# Patient Record
Sex: Male | Born: 1990 | Hispanic: Yes | Marital: Single | Smoking: Never smoker
Health system: Southern US, Community
[De-identification: ages and names within clinical notes are randomized; demographics above are authoritative.]

---

## 2011-05-12 ENCOUNTER — Emergency Department (HOSPITAL_COMMUNITY): Payer: Self-pay

## 2011-05-12 ENCOUNTER — Emergency Department (HOSPITAL_COMMUNITY)
Admission: EM | Admit: 2011-05-12 | Discharge: 2011-05-12 | Disposition: A | Discharge status: Home / Self Care | Payer: Self-pay | Attending: Emergency Medicine | Admitting: Emergency Medicine

## 2011-05-12 ENCOUNTER — Encounter: Payer: Self-pay | Admitting: Adult Health

## 2011-05-12 DIAGNOSIS — S61209A Unspecified open wound of unspecified finger without damage to nail, initial encounter: Secondary | ICD-10-CM | POA: Insufficient documentation

## 2011-05-12 DIAGNOSIS — Y9269 Other specified industrial and construction area as the place of occurrence of the external cause: Secondary | ICD-10-CM | POA: Insufficient documentation

## 2011-05-12 DIAGNOSIS — S61219A Laceration without foreign body of unspecified finger without damage to nail, initial encounter: Secondary | ICD-10-CM

## 2011-05-12 DIAGNOSIS — Y990 Civilian activity done for income or pay: Secondary | ICD-10-CM | POA: Insufficient documentation

## 2011-05-12 DIAGNOSIS — W260XXA Contact with knife, initial encounter: Secondary | ICD-10-CM | POA: Insufficient documentation

## 2011-05-12 DIAGNOSIS — S62639A Displaced fracture of distal phalanx of unspecified finger, initial encounter for closed fracture: Secondary | ICD-10-CM | POA: Insufficient documentation

## 2011-05-12 MED ORDER — OXYCODONE-ACETAMINOPHEN 5-325 MG PO TABS
1.0000 | ORAL_TABLET | Freq: Once | ORAL | Status: AC
  Administered 2011-05-12: 1 via ORAL
  Filled 2011-05-12: qty 1

## 2011-05-12 MED ORDER — TETANUS-DIPHTH-ACELL PERTUSSIS 5-2.5-18.5 LF-MCG/0.5 IM SUSP
0.5000 mL | Freq: Once | INTRAMUSCULAR | Status: AC
  Administered 2011-05-12: 0.5 mL via INTRAMUSCULAR
  Filled 2011-05-12: qty 0.5

## 2011-05-12 MED ORDER — LIDOCAINE HCL 2 % IJ SOLN
INTRAMUSCULAR | Status: AC
  Administered 2011-05-12: 3 mL via SUBCUTANEOUS
  Filled 2011-05-12: qty 1

## 2011-05-12 MED ORDER — CEPHALEXIN 500 MG PO CAPS: 500.0000 mg | ORAL_CAPSULE | Freq: Four times a day (QID) | ORAL | Status: AC

## 2011-05-12 MED ORDER — HYDROCODONE-ACETAMINOPHEN 5-325 MG PO TABS: 2.0000 | ORAL_TABLET | ORAL | Status: AC | PRN

## 2011-05-12 NOTE — ED Notes (Signed)
Cut left pointer and middle finger with knife while at work/

## 2011-05-12 NOTE — ED Provider Notes (Signed)
History     CSN: [id number] Arrival date & time: 05/12/2011  3:50 PM   First MD Initiated Contact with Patient 05/12/11 1552      Chief Complaint  Patient presents with  . Laceration    (Consider location/radiation/quality/duration/timing/severity/associated sxs/prior treatment) HPI Comments: Patient reports that he was cutting a garden hose with a knife and cut his 2nd and 3rd digit.  He was seen by urgent care and then sent to the Emergency Department from there.  Patient is a 20 y.o. male presenting with skin laceration. The history is provided by the patient.  Laceration  The incident occurred 1 to 2 hours ago. Pain location: 2nd and 3rd digit left hand. Size: approximately 1.5 cm. The laceration mechanism was a a dirty knife. The pain is moderate. It is unknown if a foreign body is present. His tetanus status is unknown.    History reviewed. No pertinent past medical history.  History reviewed. No pertinent past surgical history.  History reviewed. No pertinent family history.  History  Substance Use Topics  . Smoking status: Never Smoker   . Smokeless tobacco: Not on file  . Alcohol Use: No      Review of Systems  Constitutional: Negative for fever, chills and diaphoresis.  Respiratory: Negative for cough, chest tightness and shortness of breath.   Cardiovascular: Negative for chest pain.  Gastrointestinal: Negative for nausea, vomiting and abdominal pain.  Skin: Negative for color change and pallor.  Neurological: Negative for dizziness, tremors, light-headedness and numbness.    Allergies  Review of patient's allergies indicates no known allergies.  Home Medications   Current Outpatient Rx  Name Route Sig Dispense Refill  . CEPHALEXIN 500 MG PO CAPS Oral Take 1 capsule (500 mg total) by mouth 4 (four) times daily. 20 capsule 0  . HYDROCODONE-ACETAMINOPHEN 5-325 MG PO TABS Oral Take 2 tablets by mouth every 4 (four) hours as needed for pain. 10 tablet 0     BP 144/77  Pulse 92  Temp(Src) 98.4 F (36.9 C) (Oral)  Resp 16  SpO2 99%  Physical Exam  Constitutional: He is oriented to person, place, and time. He appears well-developed and well-nourished.  HENT:  Head: Normocephalic.  Cardiovascular: Normal rate, regular rhythm, normal heart sounds and intact distal pulses.   Pulmonary/Chest: Effort normal and breath sounds normal.  Musculoskeletal:       Full ROM of the all of the fingers of the left hand.  Normal sensation and normal movement of the distal 2nd and 3rd digits on the left hand.  Neurological: He is alert and oriented to person, place, and time. No sensory deficit. Gait normal.  Skin:       Approximately 1.5 cm laceration over the DIP joint on the palmar surface of the 2nd and 3rd digit.  Fingers neurovascularly intact.  Psychiatric: He has a normal mood and affect.    ED Course  Procedures (including critical care time)  Labs Reviewed - No data to display Dg Hand Complete Left  05/12/2011  *RADIOLOGY REPORT*  Clinical Data: Laceration middle and index finger  LEFT HAND - COMPLETE 3+ VIEW  Comparison: None.  Findings: Three views of the left hand submitted.  No displaced fracture is noted.  No radiopaque foreign body.  Tiny avulsion fracture noted distal phalanx second finger.  IMPRESSION: Tiny avulsion fracture of the distal phalanx of the left second finger.  No radiopaque foreign body.  Original Report Authenticated By: Natasha Mead, M.D.  1. Laceration of finger   2. Avulsion fracture of distal phalanx of finger     1:18 PM Discussed the patient with Dr. Lajoyce Corners with hand surgery.  He recommended cleaning the area and placing a couple of sutures to loosely hold the skin together and putting the patient on Keflex.  He reports that he will follow up with the patient in the office on Monday.  LACERATION REPAIR Performed by: Anne Shutter, Farren Landa Authorized by: Anne Shutter, Herbert Seta Consent: Verbal consent  obtained. Risks and benefits: risks, benefits and alternatives were discussed Consent given by: patient Patient identity confirmed: provided demographic data Prepped and Draped in normal sterile fashion Wound explored  Laceration Location: Palmer surface of 2nd digit over the DIP joint.  Laceration Length: 1.5 cm  No Foreign Bodies seen or palpated  Anesthesia: local infiltration  Local anesthetic: lidocaine 2% without epinephrine  Anesthetic total: 3 ml  Irrigation method: syringe Amount of cleaning: standard  Skin closure: 3-0 Prolene  Number of sutures: 2  Technique:  Simple interuputed  Patient tolerance: Patient tolerated the procedure well with no immediate complications.  MDM  Patient neurovascularly intact.  Area has stopped bleeding.  Sutures placed and patient has f/u appointment with Dr. Lajoyce Corners on Monday.  Patient's tetanus given while in the ED and patient prescribed Keflex.        [facility] 05/13/11 1325

## 2011-05-14 NOTE — ED Provider Notes (Signed)
Medical screening examination/treatment/procedure(s) were performed by non-physician practitioner and as supervising physician I was immediately available for consultation/collaboration.   Sidharth Leverette, MD 05/14/11 1527 

## 2013-03-10 IMAGING — CR DG HAND COMPLETE 3+V*L*
3 series · 3 of 3 positions shown · non-contrast
Comparison: None.

CLINICAL DATA: Laceration middle and index finger

LEFT HAND - COMPLETE 3+ VIEW

[x hand pa left]
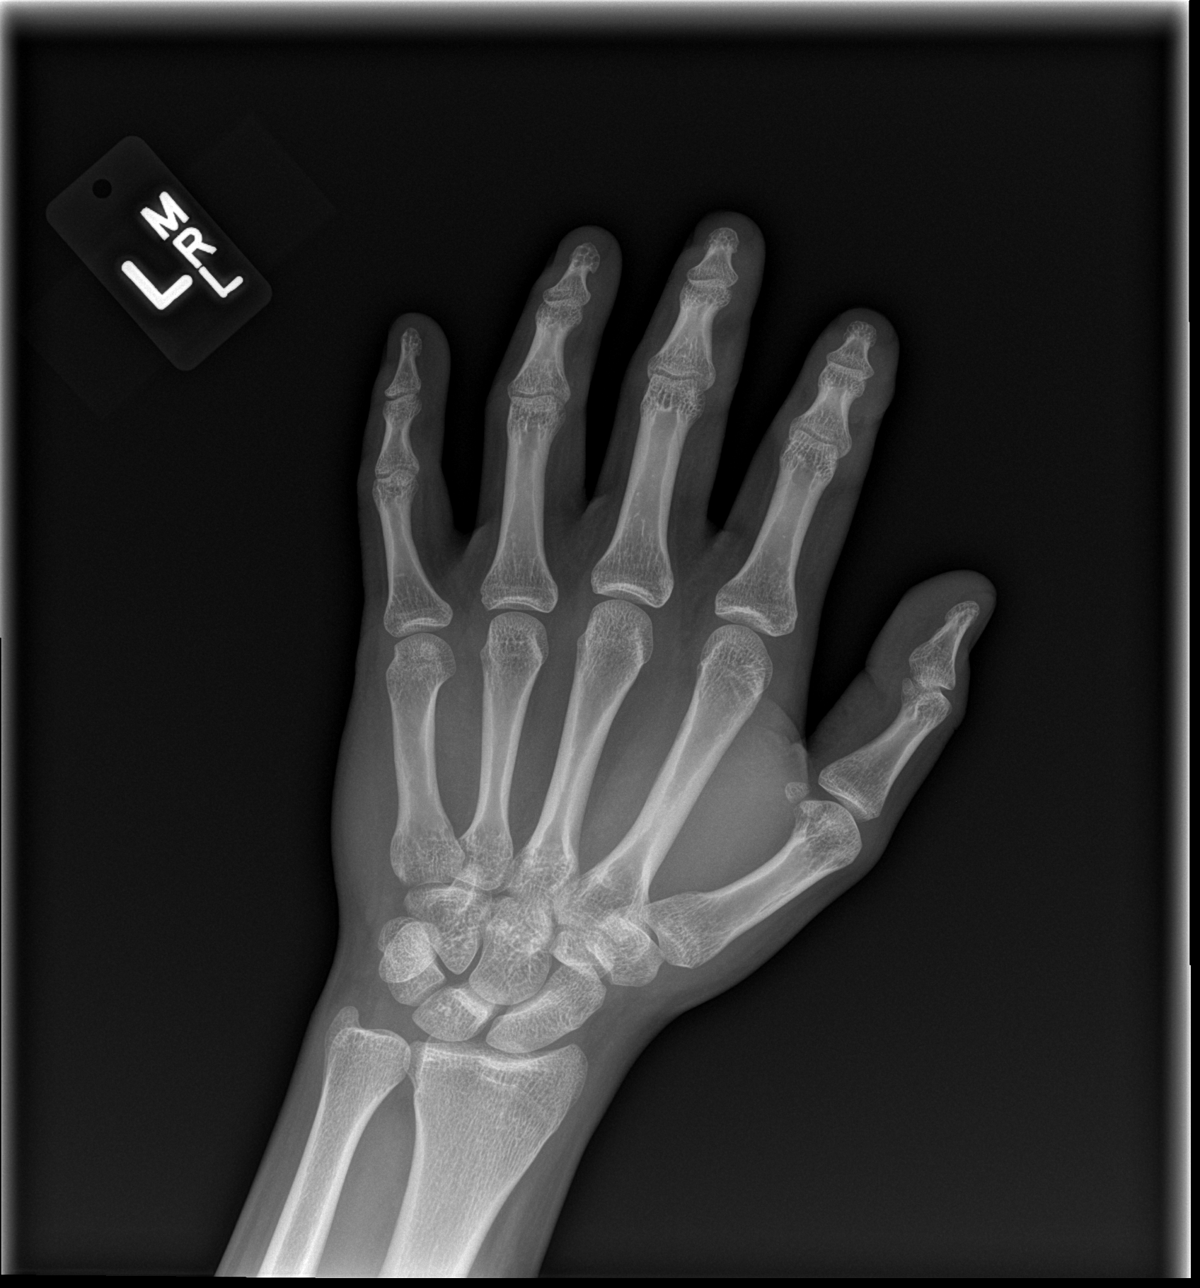

[x hand obl left]
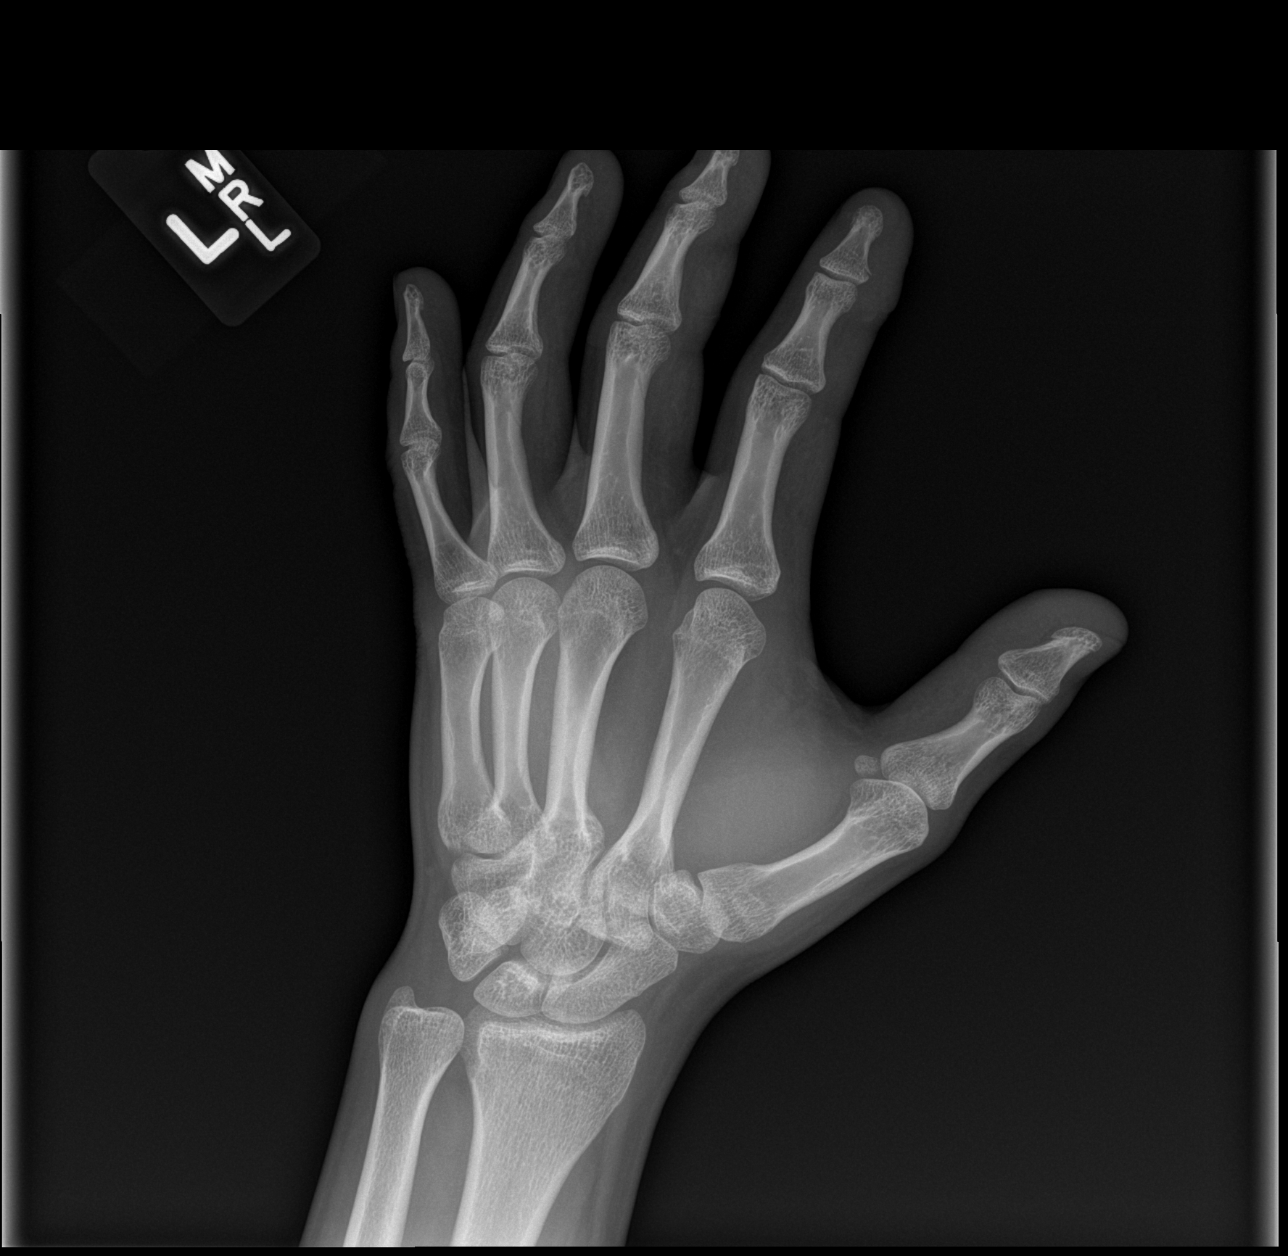

[x hand lat left]
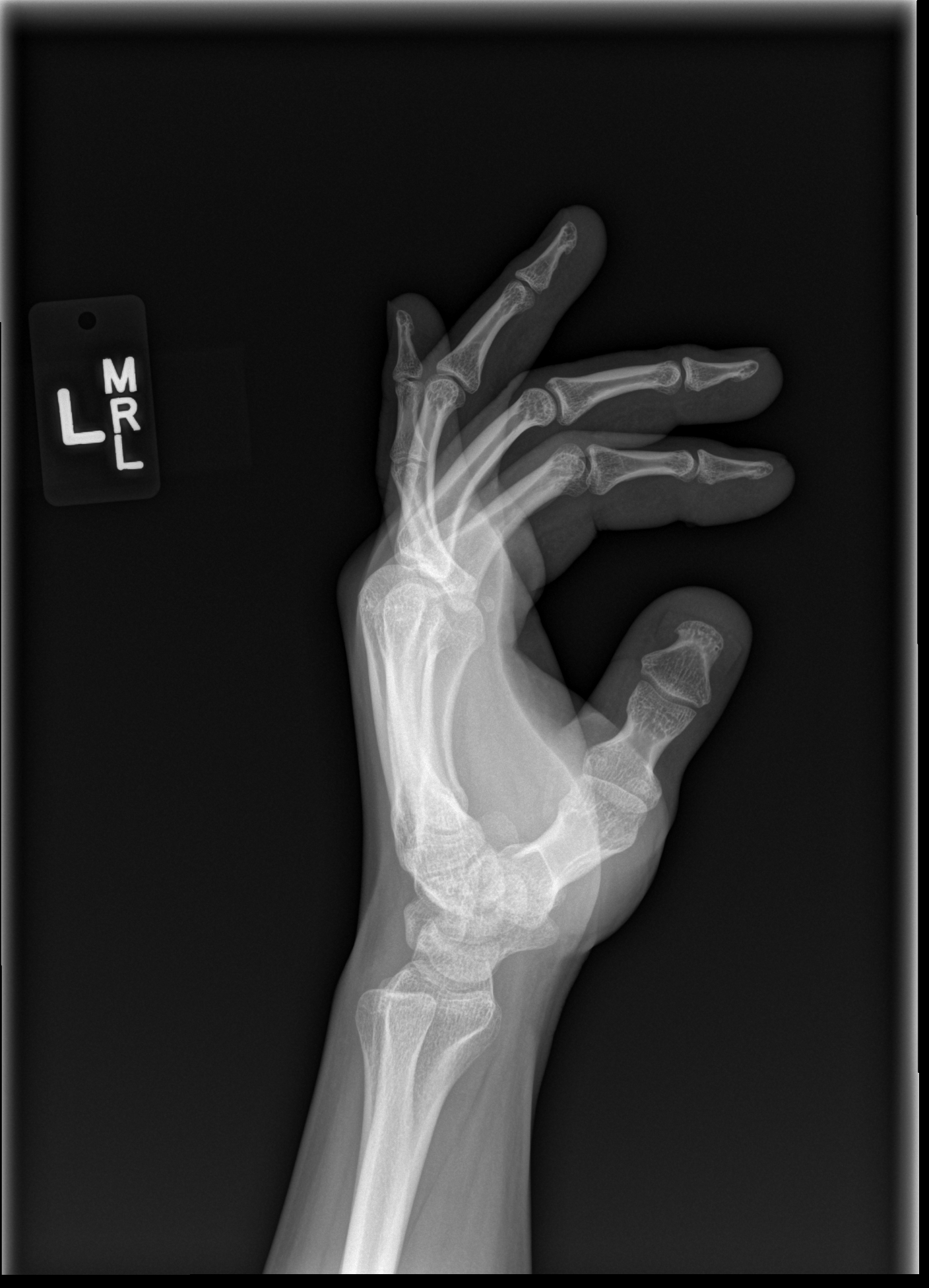

[3 of 3 positions shown; findings below may reference images not displayed]

FINDINGS: Three views of the left hand submitted.  No displaced
fracture is noted.  No radiopaque foreign body.  Tiny avulsion
fracture noted distal phalanx second finger.
IMPRESSION: Tiny avulsion fracture of the distal phalanx of the left second
finger.  No radiopaque foreign body.

## 2023-01-17 ENCOUNTER — Ambulatory Visit
Admission: EM | Admit: 2023-01-17 | Discharge: 2023-01-17 | Disposition: A | Discharge status: Home / Self Care | Payer: Self-pay | Attending: Internal Medicine | Admitting: Internal Medicine

## 2023-01-17 DIAGNOSIS — S39012A Strain of muscle, fascia and tendon of lower back, initial encounter: Secondary | ICD-10-CM

## 2023-01-17 MED ORDER — IBUPROFEN 600 MG PO TABS: 600.0000 mg | ORAL_TABLET | Freq: Four times a day (QID) | ORAL | 0 refills | Status: AC | PRN

## 2023-01-17 MED ORDER — KETOROLAC TROMETHAMINE 30 MG/ML IJ SOLN
30.0000 mg | Freq: Once | INTRAMUSCULAR | Status: AC
  Administered 2023-01-17: 30 mg via INTRAMUSCULAR

## 2023-01-17 MED ORDER — METHOCARBAMOL 500 MG PO TABS: 500.0000 mg | ORAL_TABLET | Freq: Two times a day (BID) | ORAL | 0 refills | Status: AC

## 2023-01-17 NOTE — ED Provider Notes (Signed)
[facility]    CSN: [id number] Arrival date & time: 01/17/23  1106      History   Chief Complaint Chief Complaint  Patient presents with   Back Pain    HPI Zachary Marquez is a 32 y.o. male.   Patient presents to urgent care for evaluation of right low back pain that started yesterday morning while he was at work. States he had to pick up a heavy case of meat and turn his torso to the right with the object to set it down to the right. He placed the object down and started having pain immediately to the right low back. 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 Pain worsened throughout the day but he kept working. Low back pain does not radiate and is triggered by movement and position changes. Pain is currently 7/10 and described as a "sore muscle ache". Movement makes pain "intense". Not moving makes pain go away. No numbness or tingling to bilateral lower extremities, extremity weakness, urinary symptoms, loss of bowel/bladder control, or saddle anesthesia symptoms. No midline tenderness. Purchased over the counter pain medication this morning, believes it may have been tylenol, and took this with some relief.    Back Pain   History reviewed. No pertinent past medical history.  There are no problems to display for this patient.   History reviewed. No pertinent surgical history.     Home Medications    Prior to Admission medications   Medication Sig Start Date End Date Taking? Authorizing Provider  ibuprofen (ADVIL) 600 MG tablet Take 1 tablet (600 mg total) by mouth every 6 (six) hours as needed. 01/17/23  Yes Carlisle Beers, FNP  methocarbamol (ROBAXIN) 500 MG tablet Take 1 tablet (500 mg total) by mouth 2 (two) times daily. 01/17/23  Yes Carlisle Beers, FNP    Family History History reviewed. No pertinent family history.  Social  History Social History   Tobacco Use   Smoking status: Never  Substance Use Topics   Alcohol use: No   Drug use: No     Allergies   Patient has no known allergies.   Review of Systems Review of Systems  Musculoskeletal:  Positive for back pain.     Physical Exam Triage Vital Signs ED Triage Vitals  Encounter Vitals Group     BP 01/17/23 1151 120/75     Systolic BP Percentile --      Diastolic BP Percentile --      Pulse Rate 01/17/23 1151 63     Resp 01/17/23 1151 18     Temp 01/17/23 1151 97.9 F (36.6 C)     Temp Source 01/17/23 1151 Oral     SpO2 01/17/23 1151 98 %     Weight --      Height --      Head Circumference --      Peak Flow --      Pain Score 01/17/23 1152 7     Pain Loc --      Pain Education --      Exclude from Growth Chart --    No data found.  Updated Vital Signs BP 120/75 (BP Location: Right Arm)   Pulse 63   Temp 97.9 F (36.6 C) (Oral)   Resp 18   SpO2 98%   Visual Acuity Right Eye Distance:   Left Eye  Distance:   Bilateral Distance:    Right Eye Near:   Left Eye Near:    Bilateral Near:     Physical Exam Musculoskeletal:     Cervical back: Normal.     Thoracic back: Normal.     Lumbar back: Tenderness present. No swelling, edema, deformity, signs of trauma, lacerations, spasms or bony tenderness. Normal range of motion. Negative right straight leg raise test and negative left straight leg raise test. No scoliosis.       Back:     Comments: TTP to the right sided lower lumbar paraspinals. Strength and sensation intact to bilateral upper and lower extremities (5/5). Moves all 4 extremities with normal coordination voluntarily. Non-focal neuro exam.       UC Treatments / Results  Labs (all labs ordered are listed, but only abnormal results are displayed) Labs Reviewed - No data to display  EKG   Radiology No results found.  Procedures Procedures (including critical care time)  Medications Ordered in  UC Medications  ketorolac (TORADOL) 30 MG/ML injection 30 mg (30 mg Intramuscular Given 01/17/23 1250)    Initial Impression / Assessment and Plan / UC Course  I have reviewed the triage vital signs and the nursing notes.  Pertinent labs & imaging results that were available during my care of the patient were reviewed by me and considered in my medical decision making (see chart for details).   1. Strain of lumbar region, initial encounter Evaluation suggests pain is muscular in nature. Will manage this with rest, gentle ROM exercises, heat therapy, ibuprofen as needed for pain, and as needed use of muscle relaxer. Drowsiness precautions discussed regarding muscle relaxer use. Ketorolac given in clinic (no NSAIDs for 24 hours).  Imaging: no indication for imaging based on stable musculoskeletal exam findings May follow-up with orthopedics as needed. Work note given.  Counseled patient on potential for adverse effects with medications prescribed/recommended today, strict ER and return-to-clinic precautions discussed, patient verbalized understanding.   Final Clinical Impressions(s) / UC Diagnoses   Final diagnoses:  Strain of lumbar region, initial encounter     Discharge Instructions      Your pain is likely due to a muscle strain which will improve on its own with time.   - You may take over the counter medicines to help with pain.  - If you were given a shot of pain medicine in the clinic today, you may start taking ibuprofen/other NSAIDs in 12-24 hours. - You may also take the prescribed muscle relaxer as directed as needed for muscle aches/spasm.  Do not take this medication and drive or drink alcohol as it can make you sleepy.  Mainly use this medicine at nighttime as needed. - Apply heat 20 minutes on then 20 minutes off and perform gentle range of motion exercises to the area of greatest pain to prevent muscle stiffness and provide further pain relief.   Red flag symptoms to  watch out for are numbness/tingling to the legs, weakness, loss of bowel/bladder control, and/or worsening pain that does not respond well to medicines. Follow-up with your primary care provider or return to urgent care if your symptoms do not improve in the next 3 to 4 days with medications and interventions recommended today. If your symptoms are severe (red flag), please go to the emergency room.       ED Prescriptions     Medication Sig Dispense Auth. Provider   methocarbamol (ROBAXIN) 500 MG tablet Take 1 tablet (500 mg  total) by mouth 2 (two) times daily. 20 tablet Reita May M, FNP   ibuprofen (ADVIL) 600 MG tablet Take 1 tablet (600 mg total) by mouth every 6 (six) hours as needed. 30 tablet Carlisle Beers, FNP      PDMP not reviewed this encounter.   Carlisle Beers, [place] 01/17/23 1253

## 2023-01-17 NOTE — Discharge Instructions (Signed)
Your pain is likely due to a muscle strain which will improve on its own with time.   - You may take over the counter medicines to help with pain.  - If you were given a shot of pain medicine in the clinic today, you may start taking ibuprofen/other NSAIDs in 12-24 hours. - You may also take the prescribed muscle relaxer as directed as needed for muscle aches/spasm.  Do not take this medication and drive or drink alcohol as it can make you sleepy.  Mainly use this medicine at nighttime as needed. - Apply heat 20 minutes on then 20 minutes off and perform gentle range of motion exercises to the area of greatest pain to prevent muscle stiffness and provide further pain relief.   Red flag symptoms to watch out for are numbness/tingling to the legs, weakness, loss of bowel/bladder control, and/or worsening pain that does not respond well to medicines. Follow-up with your primary care provider or return to urgent care if your symptoms do not improve in the next 3 to 4 days with medications and interventions recommended today. If your symptoms are severe (red flag), please go to the emergency room.   

## 2023-01-17 NOTE — ED Triage Notes (Signed)
Pt reports he has low right side back pain after picking up a heavy box of meat and turning when lifting x 1 day  Took tylenol but no relief.Marland Kitchen
